# Patient Record
Sex: Female | Born: 1995 | Race: White | Hispanic: No | Marital: Single | Smoking: Never smoker
Health system: Southern US, Community
[De-identification: ages and names within clinical notes are randomized; demographics above are authoritative.]

## PROBLEM LIST (undated history)

## (undated) DIAGNOSIS — S060XAA Concussion with loss of consciousness status unknown, initial encounter: Secondary | ICD-10-CM

## (undated) DIAGNOSIS — S060X9A Concussion with loss of consciousness of unspecified duration, initial encounter: Secondary | ICD-10-CM

## (undated) HISTORY — PX: TONSILLECTOMY: SUR1361

## (undated) HISTORY — PX: CHOLECYSTECTOMY: SHX55

## (undated) HISTORY — PX: FRACTURE SURGERY: SHX138

---

## 2017-12-20 ENCOUNTER — Encounter (HOSPITAL_BASED_OUTPATIENT_CLINIC_OR_DEPARTMENT_OTHER): Payer: Self-pay | Admitting: Emergency Medicine

## 2017-12-20 ENCOUNTER — Emergency Department (HOSPITAL_BASED_OUTPATIENT_CLINIC_OR_DEPARTMENT_OTHER)
Admission: EM | Admit: 2017-12-20 | Discharge: 2017-12-20 | Disposition: A | Discharge status: Home / Self Care | Payer: 59 | Attending: Emergency Medicine | Admitting: Emergency Medicine

## 2017-12-20 ENCOUNTER — Other Ambulatory Visit: Payer: Self-pay

## 2017-12-20 DIAGNOSIS — Z9049 Acquired absence of other specified parts of digestive tract: Secondary | ICD-10-CM | POA: Insufficient documentation

## 2017-12-20 DIAGNOSIS — Y998 Other external cause status: Secondary | ICD-10-CM | POA: Insufficient documentation

## 2017-12-20 DIAGNOSIS — W208XXA Other cause of strike by thrown, projected or falling object, initial encounter: Secondary | ICD-10-CM | POA: Insufficient documentation

## 2017-12-20 DIAGNOSIS — S0990XA Unspecified injury of head, initial encounter: Secondary | ICD-10-CM | POA: Diagnosis not present

## 2017-12-20 DIAGNOSIS — Y9389 Activity, other specified: Secondary | ICD-10-CM | POA: Diagnosis not present

## 2017-12-20 DIAGNOSIS — Y92512 Supermarket, store or market as the place of occurrence of the external cause: Secondary | ICD-10-CM | POA: Insufficient documentation

## 2017-12-20 LAB — PREGNANCY, URINE: PREG TEST UR: NEGATIVE

## 2017-12-20 MED ORDER — ACETAMINOPHEN 325 MG PO TABS
ORAL_TABLET | ORAL | Status: AC
  Filled 2017-12-20: qty 2

## 2017-12-20 MED ORDER — METOCLOPRAMIDE HCL 5 MG/ML IJ SOLN: 10.0000 mg | Freq: Once | INTRAMUSCULAR | Status: DC

## 2017-12-20 MED ORDER — DIPHENHYDRAMINE HCL 50 MG/ML IJ SOLN: 12.5000 mg | Freq: Once | INTRAMUSCULAR | Status: DC

## 2017-12-20 MED ORDER — KETOROLAC TROMETHAMINE 15 MG/ML IJ SOLN: 15.0000 mg | Freq: Once | INTRAMUSCULAR | Status: DC

## 2017-12-20 MED ORDER — SODIUM CHLORIDE 0.9 % IV BOLUS: 500.0000 mL | Freq: Once | INTRAVENOUS | Status: DC

## 2017-12-20 MED ORDER — ACETAMINOPHEN 325 MG PO TABS: 650.0000 mg | ORAL_TABLET | Freq: Once | ORAL | Status: DC

## 2017-12-20 NOTE — ED Triage Notes (Signed)
Reports head injury yesterday.  States that box hit the side of her head yesterday.  Denies LOC but reports history of 3 previous concussions.  States she believes she has another concussion.  C/o nausea, "pressure in head", confusion.

## 2017-12-20 NOTE — ED Provider Notes (Signed)
[facility] EMERGENCY DEPARTMENT Provider Note   CSN: [id number] Arrival date & time: 12/20/17  7253     History   Chief Complaint Chief Complaint  Patient presents with  . Head Injury    HPI Tina Gay is a 22 y.o. female presenting after head injury yesterday 1 PM.  She states that she was at [employer] and attempting to take a box off a high shelf when the box hit the left side of her head.  Patient denies loss of consciousness.  Patient states that she developed a 1/10 in severity pain that she describes as pressure behind her eyes that has been constant since she hit her head yesterday.  Patient also endorses nausea that began last night.  Patient denies vomiting.  Patient states that she has been eating and drinking since the injury without difficulty/vomiting. Patient states has had 3 concussions in the past and is concerned that she may have a concussion today.  Patient states that she has not taking anything for her headache and there has been no change in her headache since yesterday.  Patient denies loss of consciousness, use of blood thinners, vomiting, confusion, vision changes, numbness/tingling/weakness, neck pain, history of blood disorders including coagulopathies.   Head Injury   The incident occurred yesterday. She came to the ER via walk-in. The injury mechanism was a direct blow. There was no loss of consciousness. There was no blood loss. The quality of the pain is described as dull. The pain is at a severity of 1/10. The pain has been constant since the injury. Pertinent negatives include no numbness, no blurred vision, no vomiting, no tinnitus, no disorientation, no weakness and no memory loss. She has tried nothing for the symptoms.    History reviewed. No pertinent past medical history.  There are no active problems to display for this patient.   Past Surgical History:  Procedure Laterality Date  . CHOLECYSTECTOMY    . FRACTURE SURGERY    .  TONSILLECTOMY       OB History   None      Home Medications    Prior to Admission medications   Not on File    Family History History reviewed. No pertinent family history.  Social History Social History   Tobacco Use  . Smoking status: Never Smoker  . Smokeless tobacco: Never Used  Substance Use Topics  . Alcohol use: Yes    Frequency: Never  . Drug use: Never     Allergies   Patient has no known allergies.   Review of Systems Review of Systems  Constitutional: Negative.  Negative for fatigue and fever.  HENT: Negative.  Negative for hearing loss, rhinorrhea, sore throat and tinnitus.   Eyes: Negative.  Negative for blurred vision, photophobia and visual disturbance.  Respiratory: Negative.  Negative for shortness of breath.   Cardiovascular: Negative.  Negative for chest pain.  Gastrointestinal: Positive for nausea. Negative for abdominal pain, diarrhea and vomiting.  Genitourinary: Negative.  Negative for dysuria and hematuria.  Musculoskeletal: Negative.  Negative for arthralgias, back pain and neck pain.  Skin: Negative.  Negative for wound.  Neurological: Positive for headaches. Negative for dizziness, syncope, weakness and numbness.  Hematological: Negative.  Does not bruise/bleed easily.  Psychiatric/Behavioral: Negative for memory loss.    Physical Exam Updated Vital Signs BP 118/76 (BP Location: Right Arm)   Pulse 65   Temp 98.2 F (36.8 C) (Oral)   Resp 16   Ht 5\' 3"  (1.6 m)  Wt 54.4 kg (120 lb)   LMP 12/20/2017 (Exact Date)   SpO2 100%   BMI 21.26 kg/m   Physical Exam  Constitutional: She is oriented to person, place, and time. She appears well-developed and well-nourished. No distress.  HENT:  Head: Normocephalic and atraumatic. Head is without raccoon's eyes, without Battle's sign and without contusion.    Right Ear: Hearing, tympanic membrane, external ear and ear canal normal. No hemotympanum.  Left Ear: Hearing, tympanic  membrane, external ear and ear canal normal. No hemotympanum.  Nose: Nose normal.  Mouth/Throat: Uvula is midline, oropharynx is clear and moist and mucous membranes are normal.  Eyes: Pupils are equal, round, and reactive to light. Conjunctivae and EOM are normal.  Neck: Trachea normal, normal range of motion, full passive range of motion without pain and phonation normal. Neck supple. No spinous process tenderness and no muscular tenderness present. No neck rigidity. No tracheal deviation and normal range of motion present.  Cardiovascular: Normal rate, regular rhythm, normal heart sounds and intact distal pulses.  Pulmonary/Chest: Effort normal and breath sounds normal. No respiratory distress. She has no wheezes.  Abdominal: Soft. Bowel sounds are normal. There is no tenderness. There is no rebound and no guarding.  Musculoskeletal: Normal range of motion.  Neurological: She is alert and oriented to person, place, and time. She has normal strength. No cranial nerve deficit or sensory deficit. She displays a negative Romberg sign.  Mental Status:  Alert, oriented, thought content appropriate, able to give a coherent history. Speech fluent without evidence of aphasia. Able to follow 2 step commands without difficulty.  Cranial Nerves:  II:  Peripheral visual fields grossly normal, pupils equal, round, reactive to light III,IV, VI: ptosis not present, extra-ocular motions intact bilaterally  V,VII: smile symmetric, eyebrows raise symmetric, facial light touch sensation equal VIII: hearing grossly normal to voice  X: uvula elevates symmetrically  XI: bilateral shoulder shrug symmetric and strong XII: midline tongue extension without fassiculations Motor:  Normal tone. 5/5 in upper and lower extremities bilaterally including strong and equal grip strength and dorsiflexion/plantar flexion Sensory: Sensation intact to light touch in all extremities. Negative Romberg.  Cerebellar: normal  finger-to-nose with bilateral upper extremities. Normal heel-to -shin balance bilaterally of the lower extremity. No pronator drift.  Gait: normal gait and balance CV: distal pulses palpable throughout   Skin: Skin is warm and dry. Capillary refill takes less than 2 seconds.  Psychiatric: She has a normal mood and affect. Her behavior is normal.     ED Treatments / Results  Labs (all labs ordered are listed, but only abnormal results are displayed) Labs Reviewed  PREGNANCY, URINE    EKG None  Radiology No results found.  Procedures Procedures (including critical care time)  Medications Ordered in ED Medications  acetaminophen (TYLENOL) tablet 650 mg (has no administration in time range)     Initial Impression / Assessment and Plan / ED Course  I have reviewed the triage vital signs and the nursing notes.  Pertinent labs & imaging results that were available during my care of the patient were reviewed by me and considered in my medical decision making (see chart for details).  Clinical Course as of Dec 21 1215  Wed Dec 20, 2017  1138 I have been informed by RN that patient is refusing headache cocktail at this time.  Patient is requesting Tylenol and discharge.   [BM]    Clinical Course User Index [BM] Bill Salinas, PA-C   [place]  CT head injury/trauma rule used: Patient denies LOC, blood thinner use, seizure like activity, GCS is 15, no signs of skull fracture, denies vomiting, no history of amnesia, low risk mechanism.  No focal neuro deficits, no hemotympanum, no battle sign, raccoon eyes.  No sign of injury. CT head unnecessary at this time. ---------------------------------------------------------------------------------------  Tina Gay is a 22 y.o. female who presents to ED for evaluation after headache after minor head trauma. No focal neuro deficits on exam. Not on anti-coagulation.  Patient is Negative on [place] CT Head rule.  Doubt  intracranial bleed or skull fracture. No indication for imaging at this time. Evaluation does not show pathology that would require ongoing emergent intervention or inpatient treatment. Head injury home care instructions were discussed. Patient states understanding to return for nausea/vomiting, confusion, altered level of consciousness or new weakness.   Urine pregnancy test negative.  Patient denies history of kidney disease, gastric bleeding, drug allergies. Patient refused migraine cocktail during visit today.  Patient requested Tylenol for headache instead. Tylenol provided by nursing staff.  At this time there does not appear to be any evidence of an acute emergency medical condition and the patient appears stable for discharge with appropriate outpatient follow up. Diagnosis was discussed with patient who verbalizes understanding of care plan and is agreeable to discharge. I have discussed return precautions with patient  who verbalizes understanding of return precautions. Patient strongly encouraged to follow-up with their PCP. All questions answered.  Patient's case discussed with Dr. Criss AlvineGoldston who agrees with plan to discharge with follow-up.     Note: Portions of this report may have been transcribed using voice recognition software. Every effort was made to ensure accuracy; however, inadvertent computerized transcription errors may still be present.    Final Clinical Impressions(s) / ED Diagnoses   Final diagnoses:  Minor head injury without loss of consciousness, initial encounter    ED Discharge Orders    None       Elizabeth PalauMorelli, Eirene Rather A, PA-C 12/20/17 1220    Pricilla LovelessGoldston, Scott, MD 12/20/17 1336

## 2017-12-20 NOTE — ED Notes (Signed)
Patient refused to any IV medicine stating that she don't think she needs all these.  EDP made aware.

## 2017-12-20 NOTE — Discharge Instructions (Addendum)
Please follow-up with your primary care provider regarding your visit today. Please return to the emergency department for any new or worsening symptoms or if your symptoms do not improve. You may take anti-inflammatory medications such as Tylenol or ibuprofen for your headache as directed on the packaging. Please be sure to drink plenty of water to avoid dehydration.  You may follow-up with the [facility] for further evaluation. [phone]  Get help right away if: You have: A very bad (severe) headache that is not helped by medicine. Trouble walking or weakness in your arms and legs. Clear or bloody fluid coming from your nose or ears. Changes in your seeing (vision). Jerky movements that you cannot control (seizure). You throw up (vomit). Your symptoms get worse. You lose balance. Your speech is slurred. You pass out. You are sleepier and have trouble staying awake. The black centers of your eyes (pupils) change in size.

## 2017-12-29 ENCOUNTER — Encounter (HOSPITAL_COMMUNITY): Payer: Self-pay | Admitting: Emergency Medicine

## 2017-12-29 ENCOUNTER — Other Ambulatory Visit: Payer: Self-pay

## 2017-12-29 ENCOUNTER — Emergency Department (HOSPITAL_COMMUNITY)
Admission: EM | Admit: 2017-12-29 | Discharge: 2017-12-29 | Disposition: A | Discharge status: Home / Self Care | Payer: 59 | Attending: Emergency Medicine | Admitting: Emergency Medicine

## 2017-12-29 ENCOUNTER — Emergency Department (HOSPITAL_COMMUNITY): Payer: 59

## 2017-12-29 DIAGNOSIS — R197 Diarrhea, unspecified: Secondary | ICD-10-CM | POA: Insufficient documentation

## 2017-12-29 DIAGNOSIS — R1011 Right upper quadrant pain: Secondary | ICD-10-CM | POA: Diagnosis not present

## 2017-12-29 DIAGNOSIS — R112 Nausea with vomiting, unspecified: Secondary | ICD-10-CM | POA: Insufficient documentation

## 2017-12-29 DIAGNOSIS — M545 Low back pain: Secondary | ICD-10-CM | POA: Diagnosis not present

## 2017-12-29 HISTORY — DX: Concussion with loss of consciousness of unspecified duration, initial encounter: S06.0X9A

## 2017-12-29 HISTORY — DX: Concussion with loss of consciousness status unknown, initial encounter: S06.0XAA

## 2017-12-29 LAB — URINALYSIS, ROUTINE W REFLEX MICROSCOPIC
BILIRUBIN URINE: NEGATIVE
Glucose, UA: NEGATIVE mg/dL
Ketones, ur: 80 mg/dL — AB
Nitrite: NEGATIVE
PROTEIN: NEGATIVE mg/dL
SPECIFIC GRAVITY, URINE: 1.024 (ref 1.005–1.030)
pH: 6 (ref 5.0–8.0)

## 2017-12-29 LAB — COMPREHENSIVE METABOLIC PANEL
ALBUMIN: 4.2 g/dL (ref 3.5–5.0)
ALT: 22 U/L (ref 0–44)
AST: 19 U/L (ref 15–41)
Alkaline Phosphatase: 39 U/L (ref 38–126)
Anion gap: 12 (ref 5–15)
BUN: 9 mg/dL (ref 6–20)
CHLORIDE: 105 mmol/L (ref 98–111)
CO2: 21 mmol/L — ABNORMAL LOW (ref 22–32)
CREATININE: 0.79 mg/dL (ref 0.44–1.00)
Calcium: 9.1 mg/dL (ref 8.9–10.3)
GFR calc Af Amer: >60 mL/min (ref >60)
GFR calc non Af Amer: >60 mL/min (ref >60)
GLUCOSE: 98 mg/dL (ref 70–99)
Potassium: 3.9 mmol/L (ref 3.5–5.1)
SODIUM: 138 mmol/L (ref 135–145)
Total Bilirubin: 1.2 mg/dL (ref 0.3–1.2)
Total Protein: 6.5 g/dL (ref 6.5–8.1)

## 2017-12-29 LAB — CBC
HCT: 45 % (ref 36.0–46.0)
Hemoglobin: 15.1 g/dL — ABNORMAL HIGH (ref 12.0–15.0)
MCH: 32.7 pg (ref 26.0–34.0)
MCHC: 33.6 g/dL (ref 30.0–36.0)
MCV: 97.4 fL (ref 78.0–100.0)
PLATELETS: 198 10*3/uL (ref 150–400)
RBC: 4.62 MIL/uL (ref 3.87–5.11)
RDW: 11.9 % (ref 11.5–15.5)
WBC: 10.6 10*3/uL — ABNORMAL HIGH (ref 4.0–10.5)

## 2017-12-29 LAB — I-STAT BETA HCG BLOOD, ED (MC, WL, AP ONLY)

## 2017-12-29 LAB — LIPASE, BLOOD: LIPASE: 30 U/L (ref 11–51)

## 2017-12-29 MED ORDER — ONDANSETRON HCL 4 MG/2ML IJ SOLN
4.0000 mg | Freq: Once | INTRAMUSCULAR | Status: AC
  Administered 2017-12-29: 4 mg via INTRAVENOUS
  Filled 2017-12-29: qty 2

## 2017-12-29 MED ORDER — ONDANSETRON HCL 4 MG PO TABS: 4.0000 mg | ORAL_TABLET | Freq: Two times a day (BID) | ORAL | 0 refills | Status: AC

## 2017-12-29 MED ORDER — SODIUM CHLORIDE 0.9 % IV BOLUS
1000.0000 mL | Freq: Once | INTRAVENOUS | Status: AC
  Administered 2017-12-29: 1000 mL via INTRAVENOUS

## 2017-12-29 NOTE — ED Notes (Signed)
Medicated per order for nausea.  Given gingerale to try after the zofran kicks in.

## 2017-12-29 NOTE — Discharge Instructions (Addendum)
I have prescribed medication for nausea,please take as needed. Please follow up with PCP within 1 week for reevaluation of symptoms. If you experience any chest pain, shortness of fbreath or blood in your vomit please return to the ED

## 2017-12-29 NOTE — ED Notes (Signed)
Reports continued nausea but no more vomiting.  Sipping on gingerale at this time.  Encouraged to call for assistance as needed.

## 2017-12-29 NOTE — ED Triage Notes (Signed)
Patient complains of vomiting and right sided abdominal pain for the last few days. Patient states she had her gallbladder removed in January and the pain feels very similar to the attacks she had before having it removed. Patient alert, oriented, and in no apparent distress at this time.

## 2017-12-29 NOTE — ED Provider Notes (Signed)
[facility] EMERGENCY DEPARTMENT Provider Note   CSN: [id number] Arrival date & time: 12/29/17  1256     History   Chief Complaint Chief Complaint  Patient presents with  . Abdominal Pain    HPI Tina Gay is a 22 y.o. female.  22 year old female with a past medical history of cholecystectomy presents to the ED with a chief complaint of nausea, vomiting, diarrhea since last night.  Patient states she ate a [place] [employer] for lunch yesterday again vomiting last night.  Reports sharp abdominal pain in the right upper quadrant region worse with palpation better with rest.  He also reports back pain to the lumbar region.  States she feels the same way that she did when she had her gallbladder attacks her to surgery in January.  Also states she ate a burger day at the golf tournament and states that might cause of her symptoms.  Denies any blood in her stool or vomit.  She denies any fever, headache, urinary complaints, chest pain or shortness of breath.     Past Medical History:  Diagnosis Date  . Concussion     There are no active problems to display for this patient.   Past Surgical History:  Procedure Laterality Date  . CHOLECYSTECTOMY    . FRACTURE SURGERY    . TONSILLECTOMY       OB History   None      Home Medications    Prior to Admission medications   Not on File    Family History No family history on file.  Social History Social History   Tobacco Use  . Smoking status: Never Smoker  . Smokeless tobacco: Never Used  Substance Use Topics  . Alcohol use: Yes    Frequency: Never  . Drug use: Never     Allergies   Patient has no known allergies.   Review of Systems Review of Systems  Constitutional: Negative for chills and fever.  HENT: Negative for ear pain and sore throat.   Eyes: Negative for pain and visual disturbance.  Respiratory: Negative for cough and shortness of breath.   Cardiovascular: Negative for chest  pain and palpitations.  Gastrointestinal: Positive for abdominal pain, nausea and vomiting. Negative for blood in stool.  Genitourinary: Negative for dysuria and hematuria.  Musculoskeletal: Positive for back pain. Negative for arthralgias.  Skin: Negative for color change and rash.  Neurological: Negative for seizures and syncope.  All other systems reviewed and are negative.    Physical Exam Updated Vital Signs BP 120/78   Pulse 79   Temp 98.7 F (37.1 C) (Oral)   Resp 16   LMP 12/20/2017 (Exact Date)   SpO2 100%   Physical Exam  Constitutional: She is oriented to person, place, and time. She appears well-developed and well-nourished.  HENT:  Head: Normocephalic and atraumatic.  Cardiovascular: Normal heart sounds.  Pulmonary/Chest: Effort normal and breath sounds normal. She has no wheezes.  Abdominal: Soft. Normal appearance and bowel sounds are normal. There is tenderness in the right upper quadrant.    Neurological: She is alert and oriented to person, place, and time.  Skin: Skin is warm and dry.  Nursing note and vitals reviewed.    ED Treatments / Results  Labs (all labs ordered are listed, but only abnormal results are displayed) Labs Reviewed  COMPREHENSIVE METABOLIC PANEL - Abnormal; Notable for the following components:      Result Value   CO2 21 (*)    All  other components within normal limits  CBC - Abnormal; Notable for the following components:   WBC 10.6 (*)    Hemoglobin 15.1 (*)    All other components within normal limits  URINALYSIS, ROUTINE W REFLEX MICROSCOPIC - Abnormal; Notable for the following components:   Hgb urine dipstick SMALL (*)    Ketones, ur 80 (*)    Leukocytes, UA TRACE (*)    Bacteria, UA RARE (*)    All other components within normal limits  URINE CULTURE  LIPASE, BLOOD  I-STAT BETA HCG BLOOD, ED ([facility], [facility], [facility] ONLY)    EKG None  Radiology Ct Abdomen Pelvis Wo Contrast  Result Date: 12/29/2017 CLINICAL DATA:   22 year old female with history of right upper quadrant abdominal pain for the past 3 days. History of cholecystectomy in January 2019. EXAM: CT ABDOMEN AND PELVIS WITHOUT CONTRAST TECHNIQUE: Multidetector CT imaging of the abdomen and pelvis was performed following the standard protocol without IV contrast. COMPARISON:  No priors. FINDINGS: Lower chest: Unremarkable. Hepatobiliary: No definite cystic or solid hepatic lesions are confidently identified on today's noncontrast CT examination. Status post cholecystectomy. Pancreas: No pancreatic mass or peripancreatic fluid or inflammatory changes are confidently identified on today's noncontrast CT examination. Spleen: Unremarkable. Adrenals/Urinary Tract: There are no abnormal calcifications within the collecting system of either kidney, along the course of either ureter, or within the lumen of the urinary bladder. No hydroureteronephrosis or perinephric stranding to suggest urinary tract obstruction at this time. The unenhanced appearance of the kidneys is unremarkable bilaterally. Unenhanced appearance of the urinary bladder is normal. Bilateral adrenal glands are normal in appearance. Stomach/Bowel: Unenhanced appearance of the stomach is normal. No pathologic dilatation of small bowel or colon. Normal appendix. Vascular/Lymphatic: No atherosclerotic calcifications noted in the abdominal or pelvic vasculature. No lymphadenopathy noted in the abdomen or pelvis. Reproductive: Unenhanced appearance of the uterus and ovaries is unremarkable in appearance. Other: No significant volume of ascites. No pneumoperitoneum. Musculoskeletal: There are no aggressive appearing lytic or blastic lesions noted in the visualized portions of the skeleton. IMPRESSION: 1. No acute findings are noted in the abdomen or pelvis to account for the patient's symptoms. 2. Status post cholecystectomy. Electronically Signed   By: Trudie Reed M.D.   On: 12/29/2017 21:28     Procedures Procedures (including critical care time)  Medications Ordered in ED Medications  sodium chloride 0.9 % bolus 1,000 mL (0 mLs Intravenous Stopped 12/29/17 1849)  ondansetron (ZOFRAN) injection 4 mg (4 mg Intravenous Given 12/29/17 1742)  ondansetron (ZOFRAN) injection 4 mg (4 mg Intravenous Given 12/29/17 1939)     Initial Impression / Assessment and Plan / ED Course  I have reviewed the triage vital signs and the nursing notes.  Pertinent labs & imaging results that were available during my care of the patient were reviewed by me and considered in my medical decision making (see chart for details).     Patient is 7 months status post cholecystectomy, states she feels the same way she did prior to her gallbladder removed.  Reports nausea, vomiting, diarrhea since last night.  CBC showed slight leukocytosis 10.6. Hgb 15.1 UA showed ketones present, likely from dehydration.Leukocytes,and bacteria are present but no nitrites, she denies any urinary complaints at this time. 7:15 PM Patient still nauseated will give more zofran, then will PO challenge her.  Patient continues to vomit after 2 rounds of Zofran.  Will receive more Zofran, I have given to mother and her that if I cannot get  her vomiting under control in the ED we will obtain a CT to rule out any retained stones,or other abdominal pathology.   10:03 PM CT ABD& pelvis showed no acute findings, normal appendix. I have discussed the results with patient and mother at the bedside. Patient remains nauseated but has not thrown up in the past 2 hours. I will discharge patient with zofran for nausea.Return precautions provided.    Final Clinical Impressions(s) / ED Diagnoses   Final diagnoses:  Nausea and vomiting in adult    ED Discharge Orders    None       Claude MangesSoto, Smantha Boakye, Cordelia Poche-C 12/29/17 2235    Mesner, Barbara CowerJason, MD 12/30/17 1946

## 2017-12-30 LAB — URINE CULTURE

## 2019-04-09 ENCOUNTER — Other Ambulatory Visit: Payer: Self-pay

## 2019-04-09 DIAGNOSIS — Z20822 Contact with and (suspected) exposure to covid-19: Secondary | ICD-10-CM

## 2019-04-10 LAB — NOVEL CORONAVIRUS, NAA: SARS-CoV-2, NAA: NOT DETECTED

## 2020-04-23 ENCOUNTER — Emergency Department (HOSPITAL_COMMUNITY): Payer: 59

## 2020-04-23 ENCOUNTER — Emergency Department (HOSPITAL_COMMUNITY)
Admission: EM | Admit: 2020-04-23 | Discharge: 2020-04-23 | Disposition: A | Discharge status: Home / Self Care | Payer: 59 | Attending: Emergency Medicine | Admitting: Emergency Medicine

## 2020-04-23 ENCOUNTER — Encounter (HOSPITAL_COMMUNITY): Payer: Self-pay

## 2020-04-23 DIAGNOSIS — R102 Pelvic and perineal pain: Secondary | ICD-10-CM

## 2020-04-23 DIAGNOSIS — Z9049 Acquired absence of other specified parts of digestive tract: Secondary | ICD-10-CM | POA: Insufficient documentation

## 2020-04-23 DIAGNOSIS — N83201 Unspecified ovarian cyst, right side: Secondary | ICD-10-CM | POA: Diagnosis not present

## 2020-04-23 DIAGNOSIS — R1031 Right lower quadrant pain: Secondary | ICD-10-CM

## 2020-04-23 DIAGNOSIS — Z91010 Allergy to peanuts: Secondary | ICD-10-CM | POA: Diagnosis not present

## 2020-04-23 LAB — COMPREHENSIVE METABOLIC PANEL
ALT: 19 U/L (ref 0–44)
AST: 18 U/L (ref 15–41)
Albumin: 3.9 g/dL (ref 3.5–5.0)
Alkaline Phosphatase: 34 U/L — ABNORMAL LOW (ref 38–126)
Anion gap: 13 (ref 5–15)
BUN: 7 mg/dL (ref 6–20)
CO2: 22 mmol/L (ref 22–32)
Calcium: 9.6 mg/dL (ref 8.9–10.3)
Chloride: 107 mmol/L (ref 98–111)
Creatinine, Ser: 0.76 mg/dL (ref 0.44–1.00)
GFR, Estimated: >60 mL/min (ref >60)
Glucose, Bld: 111 mg/dL — ABNORMAL HIGH (ref 70–99)
Potassium: 3.6 mmol/L (ref 3.5–5.1)
Sodium: 142 mmol/L (ref 135–145)
Total Bilirubin: 0.6 mg/dL (ref 0.3–1.2)
Total Protein: 6.5 g/dL (ref 6.5–8.1)

## 2020-04-23 LAB — URINALYSIS, ROUTINE W REFLEX MICROSCOPIC
Bilirubin Urine: NEGATIVE
Glucose, UA: NEGATIVE mg/dL
Hgb urine dipstick: NEGATIVE
Ketones, ur: NEGATIVE mg/dL
Leukocytes,Ua: NEGATIVE
Nitrite: NEGATIVE
Protein, ur: NEGATIVE mg/dL
Specific Gravity, Urine: 1.024 (ref 1.005–1.030)
pH: 5 (ref 5.0–8.0)

## 2020-04-23 LAB — CBC
HCT: 40.9 % (ref 36.0–46.0)
Hemoglobin: 13.6 g/dL (ref 12.0–15.0)
MCH: 32.5 pg (ref 26.0–34.0)
MCHC: 33.3 g/dL (ref 30.0–36.0)
MCV: 97.8 fL (ref 80.0–100.0)
Platelets: 221 10*3/uL (ref 150–400)
RBC: 4.18 MIL/uL (ref 3.87–5.11)
RDW: 11.9 % (ref 11.5–15.5)
WBC: 7.4 10*3/uL (ref 4.0–10.5)
nRBC: 0 % (ref 0.0–0.2)

## 2020-04-23 LAB — I-STAT BETA HCG BLOOD, ED (MC, WL, AP ONLY): I-stat hCG, quantitative: <5 m[IU]/mL (ref <5)

## 2020-04-23 LAB — LIPASE, BLOOD: Lipase: 28 U/L (ref 11–51)

## 2020-04-23 MED ORDER — IOHEXOL 300 MG/ML  SOLN
100.0000 mL | Freq: Once | INTRAMUSCULAR | Status: AC | PRN
  Administered 2020-04-23: 100 mL via INTRAVENOUS

## 2020-04-23 NOTE — ED Notes (Signed)
Pt transported to CT ?

## 2020-04-23 NOTE — ED Triage Notes (Signed)
Pt states that for the past 2-3 weeks she has been having RLQ abd pain, went away and then returned, some nausea, denies fevers. Denies dysuria or abnormal discharge

## 2020-04-23 NOTE — ED Provider Notes (Signed)
[facility] EMERGENCY DEPARTMENT Provider Note   CSN: [id number] Arrival date & time: 04/23/20  0232     History Chief Complaint  Patient presents with  . Abdominal Pain    Tina Gay is a 24 y.o. female.  24 year old female with complaint of abdominal pain intermittent for the past 2 weeks initially periumbilical now more to right lower quadrant.  Patient reports sharp pains at times, dull pain at times, does go days in between pain-free.  Pain is worse with stretching out, improves lying curled up.  Associated with nausea, denies vomiting, changes in bladder habits or abnormal vaginal discharge.  Patient was seen at urgent care with concern for UTI, labs were normal and was advised to follow-up in the ER if pain persisted.  Patient reports right lower quadrant pain, worse since last night, thought to have bruising or swelling towards her right lower abdomen earlier which has since resolved however pain persists.  Reports history of cholecystectomy, family history of gallbladder cancer, reports having phantom gallbladder pain from time to time and is scheduled to follow-up with her GI in the coming weeks.        Past Medical History:  Diagnosis Date  . Concussion     There are no problems to display for this patient.   Past Surgical History:  Procedure Laterality Date  . CHOLECYSTECTOMY    . FRACTURE SURGERY    . TONSILLECTOMY       OB History   No obstetric history on file.     No family history on file.  Social History   Tobacco Use  . Smoking status: Never Smoker  . Smokeless tobacco: Never Used  Substance Use Topics  . Alcohol use: Yes  . Drug use: Never    Home Medications Prior to Admission medications   Not on File    Allergies    Peanut-containing drug products  Review of Systems   Review of Systems  Constitutional: Negative for appetite change and fever.  Respiratory: Negative for shortness of breath.     Cardiovascular: Negative for chest pain.  Gastrointestinal: Positive for abdominal pain, diarrhea and nausea. Negative for constipation and vomiting.  Genitourinary: Negative for dysuria, vaginal bleeding and vaginal discharge.  Musculoskeletal: Negative for back pain.  Skin: Positive for color change. Negative for rash and wound.  Allergic/Immunologic: Negative for immunocompromised state.  Neurological: Negative for weakness.  Hematological: Does not bruise/bleed easily.  Psychiatric/Behavioral: Negative for confusion.  All other systems reviewed and are negative.   Physical Exam Updated Vital Signs BP 109/71   Pulse 72   Temp 98.4 F (36.9 C) (Oral)   Resp 18   LMP 04/07/2020   SpO2 100%   Physical Exam Vitals and nursing note reviewed.  Constitutional:      General: She is not in acute distress.    Appearance: She is well-developed. She is not diaphoretic.  HENT:     Head: Normocephalic and atraumatic.  Cardiovascular:     Rate and Rhythm: Normal rate and regular rhythm.     Heart sounds: Normal heart sounds.  Pulmonary:     Effort: Pulmonary effort is normal.     Breath sounds: Normal breath sounds.  Abdominal:     General: Abdomen is flat.     Palpations: Abdomen is soft.     Tenderness: There is abdominal tenderness in the right lower quadrant and suprapubic area. There is no right CVA tenderness or left CVA tenderness.  Skin:  General: Skin is warm and dry.     Findings: No erythema or rash.  Neurological:     Mental Status: She is alert and oriented to person, place, and time.  Psychiatric:        Behavior: Behavior normal.     ED Results / Procedures / Treatments   Labs (all labs ordered are listed, but only abnormal results are displayed) Labs Reviewed  COMPREHENSIVE METABOLIC PANEL - Abnormal; Notable for the following components:      Result Value   Glucose, Bld 111 (*)    Alkaline Phosphatase 34 (*)    All other components within normal  limits  URINALYSIS, ROUTINE W REFLEX MICROSCOPIC - Abnormal; Notable for the following components:   APPearance HAZY (*)    All other components within normal limits  LIPASE, BLOOD  CBC  I-STAT BETA HCG BLOOD, ED ([facility], [facility], [facility] ONLY)    EKG None  Radiology CT Abdomen Pelvis W Contrast  Result Date: 04/23/2020 CLINICAL DATA:  Lower abdominal EXAM: CT ABDOMEN AND PELVIS WITH CONTRAST TECHNIQUE: Multidetector CT imaging of the abdomen and pelvis was performed using the standard protocol following bolus administration of intravenous contrast. CONTRAST:  OMNIPAQUE IOHEXOL 300 MG/ML  SOLN COMPARISON:  December 29, 2017 FINDINGS: Lower chest: Lung bases are clear. Hepatobiliary: There is fatty infiltration near the fissure for the ligamentum teres. Beyond this fatty infiltration, no focal liver lesions are evident. The gallbladder is absent. There is no biliary duct dilatation. Pancreas: There is no pancreatic mass or inflammatory focus. Spleen: No splenic lesions are evident. Adrenals/Urinary Tract: There is a 3 mm cyst in the upper pole of the left kidney. There is no hydronephrosis on either side. There is no evident renal or ureteral calculus on either side. Urinary bladder is midline with wall thickness within normal limits. Stomach/Bowel: There is no appreciable bowel wall or mesenteric thickening. There is no evident bowel obstruction. The terminal ileum appears unremarkable. There is no free air or portal venous air. Vascular/Lymphatic: No abdominal aortic aneurysm. No arterial vascular lesions evident. Major venous structures appear patent. No adenopathy by size criteria evident in the abdomen or pelvis. Several subcentimeter lymph nodes are noted in the right abdomen. Reproductive: The uterus is anteverted. There is a cyst arising from the right ovary measuring 3.3 x 2.4 cm which may represent a dominant follicle. No other adnexal mass. Other: Appendix appears normal. No abscess or ascites is  evident in the abdomen or pelvis. Musculoskeletal: No blastic or lytic bone lesions apparent. Postoperative change noted in the proximal right femur. No intramuscular or abdominal wall lesions. IMPRESSION: 1. Right ovarian cyst measuring 3.3 x 2.4 cm, a potential dominant follicle. No other adnexal mass. Note that a mass of this nature arising from right ovary potentially places right ovary at increased risk for ovarian torsion. Given right lower quadrant pain, correlation with pelvic ultrasound including Doppler assessment of the ovaries may be reasonable. 2. Normal appearing appendix. No right lower quadrant inflammatory type change in the mesentery. No bowel obstruction. No abscess in the abdomen or pelvis. 3. Subcentimeter right lower quadrant lymph nodes are considered nonspecific. In the appropriate clinical setting, early mesenteric adenitis potentially could present in this manner. No adenopathy by size criteria evident in the abdomen or pelvis. 4.  Gallbladder absent. 5. No renal or ureteral calculus. No hydronephrosis. Urinary bladder wall thickness normal. Electronically Signed   By: Bretta Bang III M.D.   On: 04/23/2020 11:30   US PELVIC  COMPLETE W TRANSVAGINAL AND TORSION R/O  Result Date: 04/23/2020 CLINICAL DATA:  Pelvic pain. EXAM: TRANSABDOMINAL AND TRANSVAGINAL ULTRASOUND OF PELVIS DOPPLER ULTRASOUND OF OVARIES TECHNIQUE: Both transabdominal and transvaginal ultrasound examinations of the pelvis were performed. Transabdominal technique was performed for global imaging of the pelvis including uterus, ovaries, adnexal regions, and pelvic cul-de-sac. It was necessary to proceed with endovaginal exam following the transabdominal exam to visualize the uterus and ovaries. Color and duplex Doppler ultrasound was utilized to evaluate blood flow to the ovaries. COMPARISON:  CT 12/29/2017. FINDINGS: Uterus Measurements: 6.6 x 2.8 x 4.3 cm. No fibroids or other mass visualized. Endometrium  Thickness: 2.1 mm.  No focal abnormality visualized. Right ovary Measurements: 4.2 x 2.4 x 3.2 cm = volume: 17.1 mL. 3.7 x 2.8 x 2.1 thinly septated right ovarian cyst. This is most likely benign. Left ovary Measurements: 2.0 x 0.9 x 2.0 cm = volume: 2.0 mL. Normal appearance/no adnexal mass. Pulsed Doppler evaluation of both ovaries demonstrates normal low-resistance arterial and venous waveforms. Other findings No abnormal free fluid. IMPRESSION: 1. 3.7 x 2.8 x 2.1 cm thinly septated right ovarian cyst. This is most likely benign. Given the patient's history of pain pregnancy test should be considered to exclude ectopic pregnancy. 2. Exam otherwise unremarkable. No evidence of torsion. No free pelvic fluid. Electronically Signed   By: Maisie Fus  Register   On: 04/23/2020 10:25    Procedures Procedures (including critical care time)  Medications Ordered in ED Medications  iohexol (OMNIPAQUE) 300 MG/ML solution 100 mL (100 mLs Intravenous Contrast Given 04/23/20 1120)    ED Course  I have reviewed the triage vital signs and the nursing notes.  Pertinent labs & imaging results that were available during my care of the patient were reviewed by me and considered in my medical decision making (see chart for details).  Clinical Course as of Apr 23 1208  Thu Apr 23, 2020  [id number] 24 year old female with complaint of right lower quadrant abdominal pain.  Reports pain onset 2 weeks ago periumbilical, intermittent since that time.  On exam found to have tenderness right upper quadrant, right lower quadrant and suprapubic areas.  Labs reassuring including normal CBC, CMP, lipase, hCG negative, urinalysis unremarkable. Denies concerns for STDs. Pelvic ultrasound shows right ovarian cyst, no evidence of torsion. Discussed results with patient, states that she has had left ovarian cyst in the past and states pain is typically lower in the pelvis compared to the pain that she is experiencing at this time.  We will  follow up with CT abdomen pelvis.   [LM]  1208 CT shows normal appendix.  Reviewed results with patient including incidental finding of fatty infiltrate of liver, patient will make dietary changes.  Reviewed small renal cyst, follow-up with PCP.  Ovarian cyst, follow with GYN.  Follow with GI if symptoms continue as well.  Return to ER for worsening or concerning symptoms.   [LM]    Clinical Course User Index [LM] Alden Hipp   MDM Rules/Calculators/A&P                          Final Clinical Impression(s) / ED Diagnoses Final diagnoses:  Right lower quadrant abdominal pain  Cyst of right ovary    Rx / DC Orders ED Discharge Orders    None       Jeannie Fend, PA-C 04/23/20 1209    Alvira Monday, MD 04/24/20 [phone]

## 2020-04-23 NOTE — Discharge Instructions (Signed)
Follow up with your GI provider as discussed. Recheck with your GYN if pain continues.  Take Motrin as needed as directed for pain.

## 2020-04-23 NOTE — ED Notes (Signed)
Pt transported to ultrasound.

## 2021-03-22 IMAGING — CT CT ABD-PELV W/ CM
2 of 4 series · 15 of 46 positions shown, 17 images · IV contrast (omnipaque)
Comparison: December 29, 2017

CLINICAL DATA: Lower abdominal

EXAM:
CT ABDOMEN AND PELVIS WITH CONTRAST
TECHNIQUE: Multidetector CT imaging of the abdomen and pelvis was performed
using the standard protocol following bolus administration of
intravenous contrast.
CONTRAST:  100mL OMNIPAQUE IOHEXOL 300 MG/ML  SOLN

[Series 3: a/p w/ 5mm · axial · 0.67mm/px · z∈[-421,-46]mm · 12 of 86 slices shown, 14 images]
[im 7/86  soft-tissue]
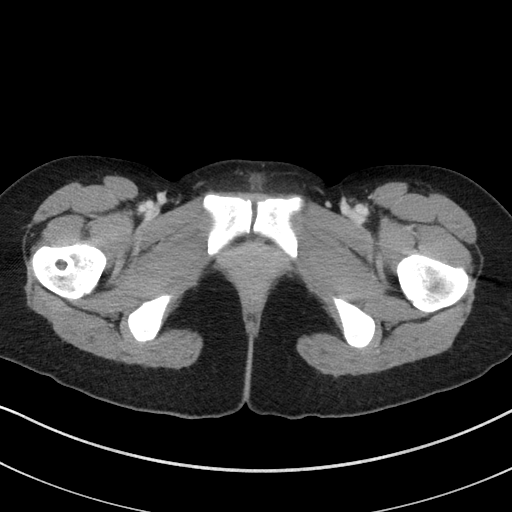
[im 7/86  bone]
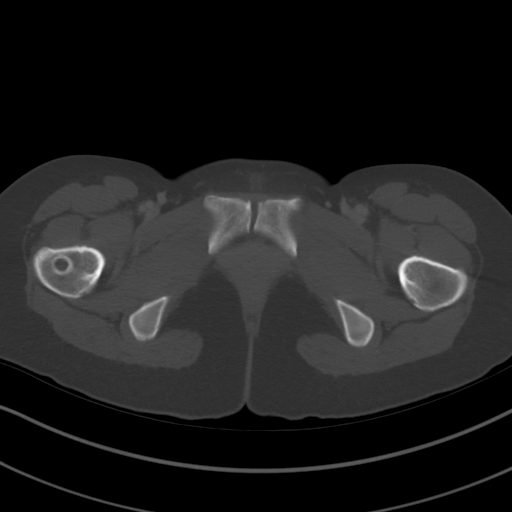
[im 14/86  soft-tissue]
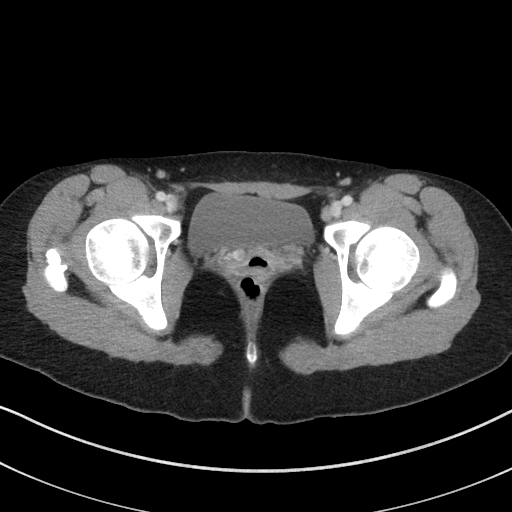
[im 21/86  soft-tissue]
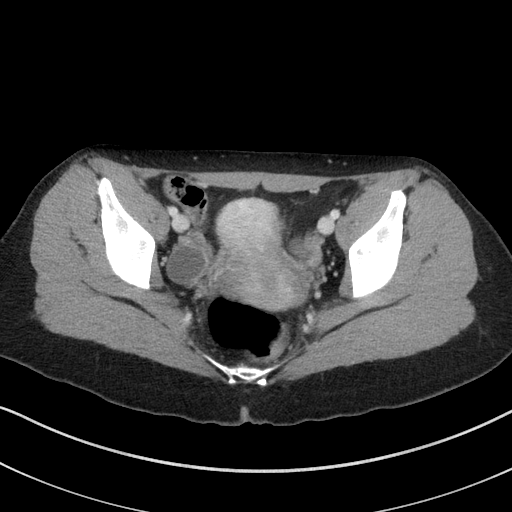
[im 28/86  soft-tissue]
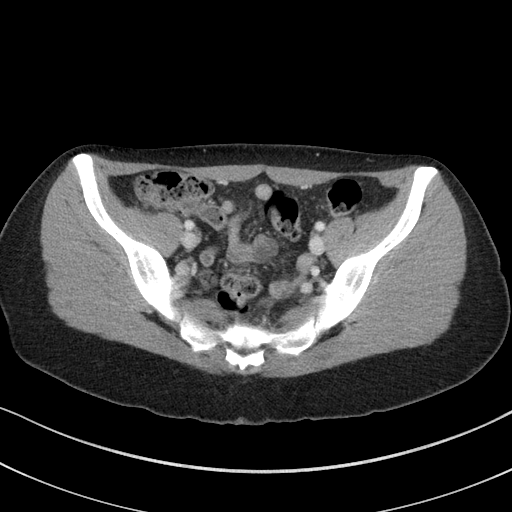
[im 35/86  soft-tissue]
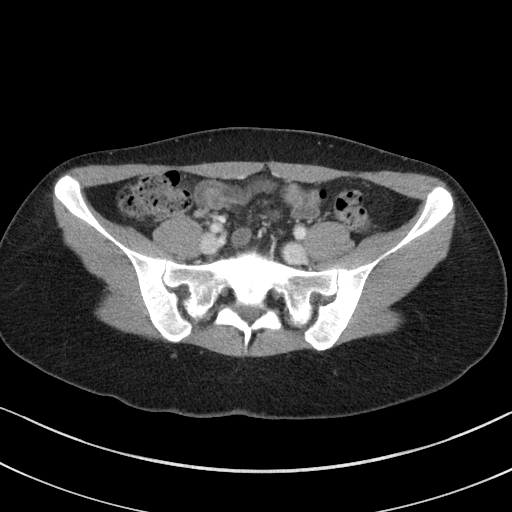
[im 41/86  soft-tissue]
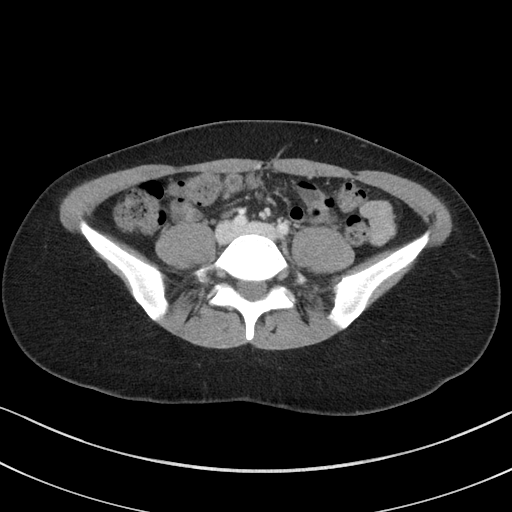
[im 48/86  soft-tissue]
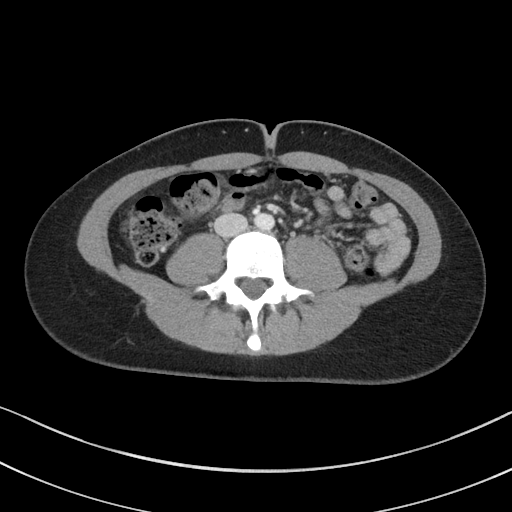
[im 55/86  soft-tissue]
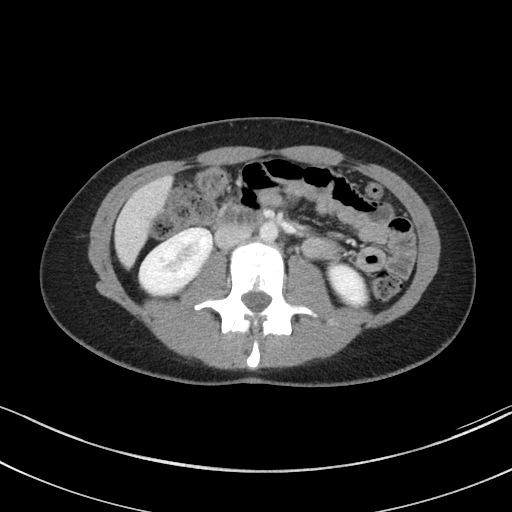
[im 62/86  soft-tissue]
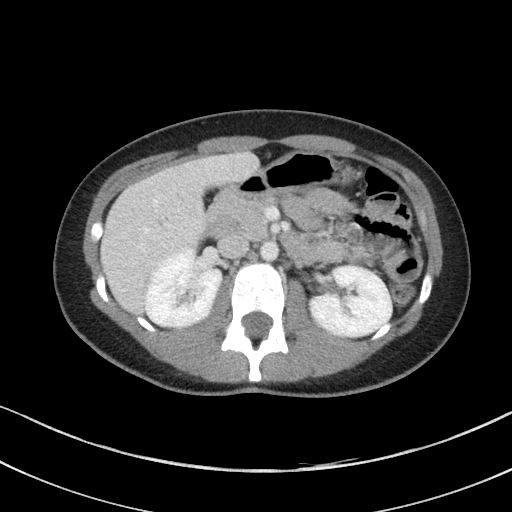
[im 62/86  bone]
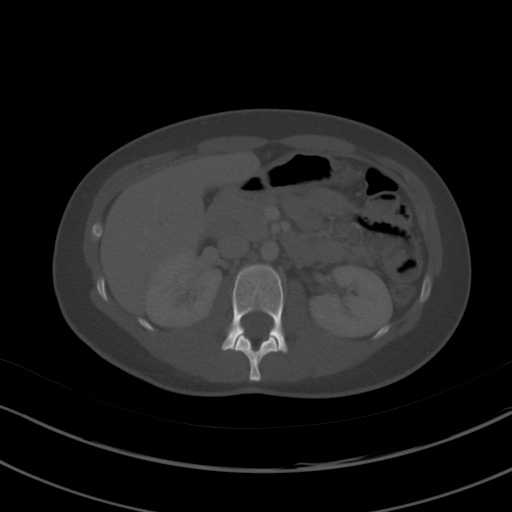
[im 69/86  soft-tissue]
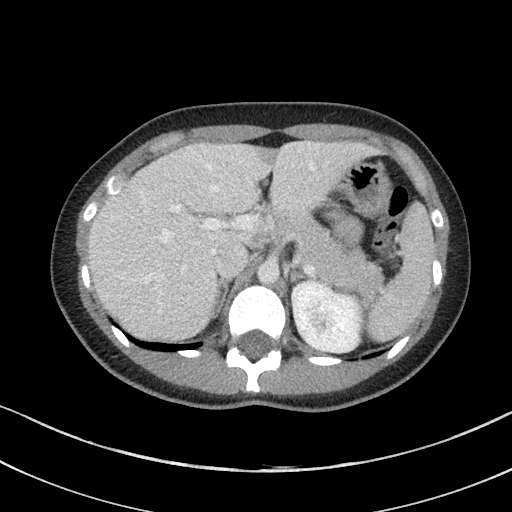
[im 75/86  soft-tissue]
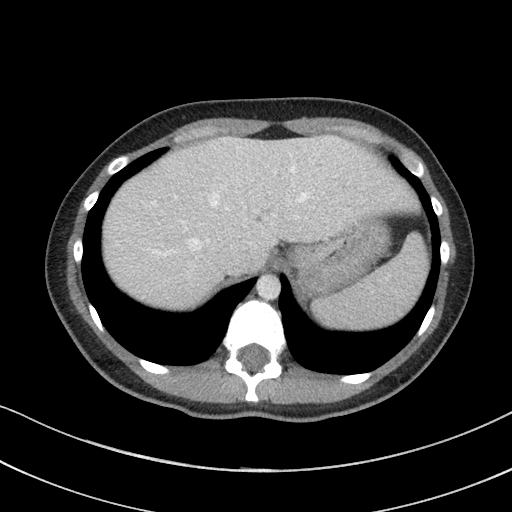
[im 82/86  soft-tissue]
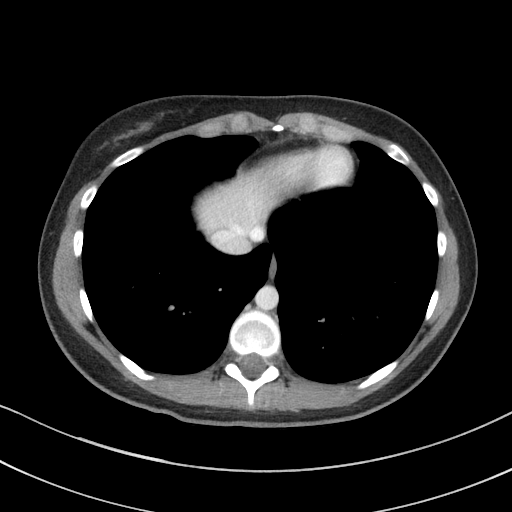

[Series 6: a/p w/ cor · coronal · 0.63mm/px · 3 of 114 slices shown]
[im 38/114  soft-tissue]
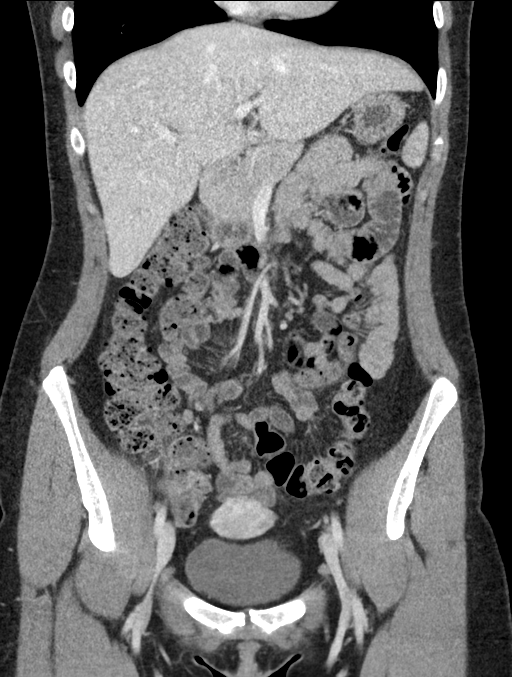
[im 51/114  soft-tissue]
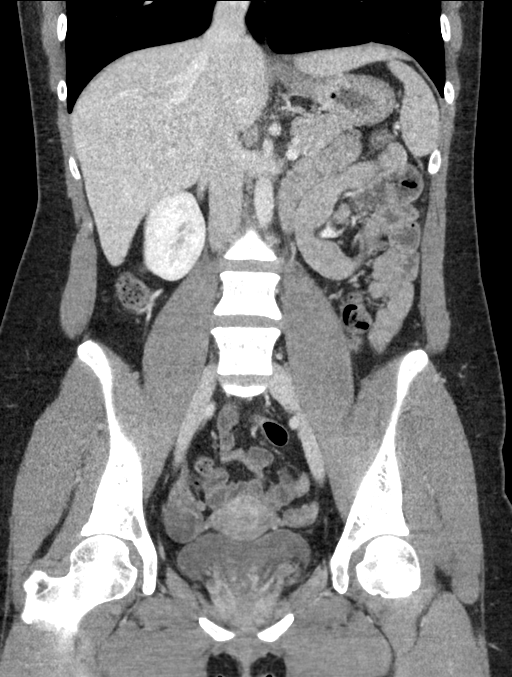
[im 63/114  soft-tissue]
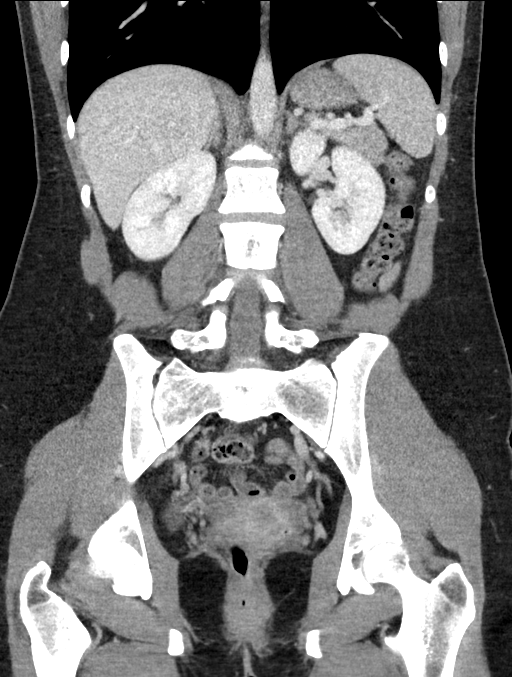

[15 of 46 positions shown; findings below may reference images not displayed]

FINDINGS: Lower chest: Lung bases are clear.

Hepatobiliary: There is fatty infiltration near the fissure for the
ligamentum teres. Beyond this fatty infiltration, no focal liver
lesions are evident. The gallbladder is absent. There is no biliary
duct dilatation.

Pancreas: There is no pancreatic mass or inflammatory focus.

Spleen: No splenic lesions are evident.

Adrenals/Urinary Tract: There is a 3 mm cyst in the upper pole of
the left kidney. There is no hydronephrosis on either side. There is
no evident renal or ureteral calculus on either side. Urinary
bladder is midline with wall thickness within normal limits.

Stomach/Bowel: There is no appreciable bowel wall or mesenteric
thickening. There is no evident bowel obstruction. The terminal
ileum appears unremarkable. There is no free air or portal venous
air.

Vascular/Lymphatic: No abdominal aortic aneurysm. No arterial
vascular lesions evident. Major venous structures appear patent. No
adenopathy by size criteria evident in the abdomen or pelvis.
Several subcentimeter lymph nodes are noted in the right abdomen.

Reproductive: The uterus is anteverted. There is a cyst arising from
the right ovary measuring 3.3 x 2.4 cm which may represent a
dominant follicle. No other adnexal mass.

Other: Appendix appears normal. No abscess or ascites is evident in
the abdomen or pelvis.

Musculoskeletal: No blastic or lytic bone lesions apparent.
Postoperative change noted in the proximal right femur. No
intramuscular or abdominal wall lesions.
IMPRESSION: 1. Right ovarian cyst measuring 3.3 x 2.4 cm, a potential dominant
follicle. No other adnexal mass. Note that a mass of this nature
arising from right ovary potentially places right ovary at increased
risk for ovarian torsion. Given right lower quadrant pain,
correlation with pelvic ultrasound including Doppler assessment of
the ovaries may be reasonable.

2. Normal appearing appendix. No right lower quadrant inflammatory
type change in the mesentery. No bowel obstruction. No abscess in
the abdomen or pelvis.

3. Subcentimeter right lower quadrant lymph nodes are considered
nonspecific. In the appropriate clinical setting, early mesenteric
adenitis potentially could present in this manner. No adenopathy by
size criteria evident in the abdomen or pelvis.

4.  Gallbladder absent.

5. No renal or ureteral calculus. No hydronephrosis. Urinary bladder
wall thickness normal.

## 2022-07-29 ENCOUNTER — Emergency Department (HOSPITAL_BASED_OUTPATIENT_CLINIC_OR_DEPARTMENT_OTHER): Payer: No Typology Code available for payment source

## 2022-07-29 ENCOUNTER — Other Ambulatory Visit: Payer: Self-pay

## 2022-07-29 ENCOUNTER — Encounter (HOSPITAL_BASED_OUTPATIENT_CLINIC_OR_DEPARTMENT_OTHER): Payer: Self-pay | Admitting: Emergency Medicine

## 2022-07-29 ENCOUNTER — Emergency Department (HOSPITAL_BASED_OUTPATIENT_CLINIC_OR_DEPARTMENT_OTHER)
Admission: EM | Admit: 2022-07-29 | Discharge: 2022-07-29 | Disposition: A | Discharge status: Home / Self Care | Payer: No Typology Code available for payment source | Attending: Emergency Medicine | Admitting: Emergency Medicine

## 2022-07-29 DIAGNOSIS — R109 Unspecified abdominal pain: Secondary | ICD-10-CM | POA: Diagnosis not present

## 2022-07-29 DIAGNOSIS — S39012A Strain of muscle, fascia and tendon of lower back, initial encounter: Secondary | ICD-10-CM | POA: Diagnosis not present

## 2022-07-29 DIAGNOSIS — K5900 Constipation, unspecified: Secondary | ICD-10-CM | POA: Insufficient documentation

## 2022-07-29 DIAGNOSIS — M5126 Other intervertebral disc displacement, lumbar region: Secondary | ICD-10-CM | POA: Diagnosis not present

## 2022-07-29 DIAGNOSIS — S199XXA Unspecified injury of neck, initial encounter: Secondary | ICD-10-CM | POA: Diagnosis present

## 2022-07-29 DIAGNOSIS — S161XXA Strain of muscle, fascia and tendon at neck level, initial encounter: Secondary | ICD-10-CM | POA: Insufficient documentation

## 2022-07-29 DIAGNOSIS — W010XXA Fall on same level from slipping, tripping and stumbling without subsequent striking against object, initial encounter: Secondary | ICD-10-CM | POA: Diagnosis not present

## 2022-07-29 DIAGNOSIS — Z91010 Allergy to peanuts: Secondary | ICD-10-CM | POA: Insufficient documentation

## 2022-07-29 DIAGNOSIS — M5136 Other intervertebral disc degeneration, lumbar region: Secondary | ICD-10-CM

## 2022-07-29 LAB — CBC
HCT: 40.1 % (ref 36.0–46.0)
Hemoglobin: 13.7 g/dL (ref 12.0–15.0)
MCH: 32.7 pg (ref 26.0–34.0)
MCHC: 34.2 g/dL (ref 30.0–36.0)
MCV: 95.7 fL (ref 80.0–100.0)
Platelets: 259 10*3/uL (ref 150–400)
RBC: 4.19 MIL/uL (ref 3.87–5.11)
RDW: 12.5 % (ref 11.5–15.5)
WBC: 11.2 10*3/uL — ABNORMAL HIGH (ref 4.0–10.5)
nRBC: 0 % (ref 0.0–0.2)

## 2022-07-29 LAB — BASIC METABOLIC PANEL
Anion gap: 8 (ref 5–15)
BUN: 14 mg/dL (ref 6–20)
CO2: 25 mmol/L (ref 22–32)
Calcium: 9.6 mg/dL (ref 8.9–10.3)
Chloride: 107 mmol/L (ref 98–111)
Creatinine, Ser: 0.9 mg/dL (ref 0.44–1.00)
GFR, Estimated: >60 mL/min (ref >60)
Glucose, Bld: 104 mg/dL — ABNORMAL HIGH (ref 70–99)
Potassium: 3.8 mmol/L (ref 3.5–5.1)
Sodium: 140 mmol/L (ref 135–145)

## 2022-07-29 LAB — URINALYSIS, ROUTINE W REFLEX MICROSCOPIC
Bilirubin Urine: NEGATIVE
Glucose, UA: NEGATIVE mg/dL
Hgb urine dipstick: NEGATIVE
Nitrite: NEGATIVE
Protein, ur: 30 mg/dL — AB
Specific Gravity, Urine: 1.037 — ABNORMAL HIGH (ref 1.005–1.030)
pH: 7 (ref 5.0–8.0)

## 2022-07-29 LAB — HEPATIC FUNCTION PANEL
ALT: 11 U/L (ref 0–44)
AST: 12 U/L — ABNORMAL LOW (ref 15–41)
Albumin: 4.3 g/dL (ref 3.5–5.0)
Alkaline Phosphatase: 41 U/L (ref 38–126)
Bilirubin, Direct: 0.1 mg/dL (ref 0.0–0.2)
Indirect Bilirubin: 0.3 mg/dL (ref 0.3–0.9)
Total Bilirubin: 0.4 mg/dL (ref 0.3–1.2)
Total Protein: 7.2 g/dL (ref 6.5–8.1)

## 2022-07-29 LAB — HCG, SERUM, QUALITATIVE: Preg, Serum: NEGATIVE

## 2022-07-29 MED ORDER — KETOROLAC TROMETHAMINE 15 MG/ML IJ SOLN
15.0000 mg | Freq: Once | INTRAMUSCULAR | Status: AC
  Administered 2022-07-29: 15 mg via INTRAVENOUS
  Filled 2022-07-29: qty 1

## 2022-07-29 MED ORDER — METHOCARBAMOL 500 MG PO TABS: 500.0000 mg | ORAL_TABLET | Freq: Two times a day (BID) | ORAL | 0 refills | Status: AC

## 2022-07-29 NOTE — Discharge Instructions (Addendum)
Please use Tylenol or ibuprofen for pain.  You may use 600 mg ibuprofen every 6 hours or 1000 mg of Tylenol every 6 hours.  You may choose to alternate between the 2.  This would be most effective.  Not to exceed 4 g of Tylenol within 24 hours.  Not to exceed 3200 mg ibuprofen 24 hours.  You can use the muscle relaxant I am prescribing in addition to the above to help with any breakthrough pain.  You can take it up to twice daily.  It is safe to take at night, but I would be cautious taking it during the day as it can cause some drowsiness.  Make sure that you are feeling awake and alert before you get behind the wheel of a car or operate a motor vehicle.  It is not a narcotic pain medication so you are able to take it if it is not making you drowsy and still pilot a vehicle or machinery safely.  Commend taking MiraLAX once daily for the next couple of days to help with any constipation that could be contributing to your abdominal pain.  As we discussed because we do not have all of the tools that we would normally have to evaluate for ovarian torsion, diverticulitis, or other intra-abdominal abnormality I would return to another one of her emergency department if you continue to have severe abdominal pain, however as you are feeling okay at this time I think it is stable to discharge you and overall low likelihood that 1 of these problems is occurring

## 2022-07-29 NOTE — ED Triage Notes (Signed)
Pt arrives to ED with c/o back pain that started x2 days ago after falling in the rain and landing on her lower back. She notes the pain radiated to her hips yesterday and today her pain has radiated to her abdomen.
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
